# Patient Record
Sex: Male | Born: 1990 | Race: White | Hispanic: No | Marital: Single | Smoking: Former smoker
Health system: Southern US, Community
[De-identification: ages and names within clinical notes are randomized; demographics above are authoritative.]

## PROBLEM LIST (undated history)

## (undated) DIAGNOSIS — J45909 Unspecified asthma, uncomplicated: Secondary | ICD-10-CM

## (undated) HISTORY — PX: ADENOIDECTOMY: SUR15

## (undated) HISTORY — PX: TONSILLECTOMY: SUR1361

---
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
## 2012-08-18 ENCOUNTER — Encounter (HOSPITAL_COMMUNITY): Payer: Self-pay | Admitting: Emergency Medicine

## 2012-08-18 ENCOUNTER — Emergency Department (INDEPENDENT_AMBULATORY_CARE_PROVIDER_SITE_OTHER)
Admission: EM | Admit: 2012-08-18 | Discharge: 2012-08-18 | Disposition: A | Discharge status: Home / Self Care | Payer: Self-pay | Source: Home / Self Care | Attending: Family Medicine | Admitting: Family Medicine

## 2012-08-18 DIAGNOSIS — J069 Acute upper respiratory infection, unspecified: Secondary | ICD-10-CM

## 2012-08-18 DIAGNOSIS — L600 Ingrowing nail: Secondary | ICD-10-CM

## 2012-08-18 LAB — POCT RAPID STREP A: Streptococcus, Group A Screen (Direct): NEGATIVE

## 2012-08-18 MED ORDER — IPRATROPIUM BROMIDE 0.06 % NA SOLN: 2.0000 | Freq: Four times a day (QID) | NASAL | Status: DC

## 2012-08-18 NOTE — ED Notes (Signed)
Pt c/o cold sx x3 days.  Sx include: sore throat, dysphagia, nasal congestion, dry cough, headache Denies: fevers, vomiting, nauseas, diarrhea  Also c/o ingrown toe nail on left big toe  He is alert w/no signs of acute distress.

## 2012-08-18 NOTE — ED Provider Notes (Signed)
History     CSN: [id number]  Arrival date & time 08/18/12  1117   First MD Initiated Contact with Patient 08/18/12 1148      Chief Complaint  Patient presents with  . URI  . Ingrown Toenail    (Consider location/radiation/quality/duration/timing/severity/associated sxs/prior treatment) Patient is a 22 y.o. male presenting with cough. The history is provided by the patient.  Cough This is a new problem. The current episode started more than 2 days ago. The problem has been gradually worsening. The cough is non-productive. Associated symptoms include chills, rhinorrhea, sore throat and myalgias. He is not a smoker.    History reviewed. No pertinent past medical history.  History reviewed. No pertinent past surgical history.  No family history on file.  History  Substance Use Topics  . Smoking status: Never Smoker   . Smokeless tobacco: Not on file  . Alcohol Use: No      Review of Systems  Constitutional: Positive for chills.  HENT: Positive for congestion, sore throat, rhinorrhea and postnasal drip.   Respiratory: Positive for cough.   Cardiovascular: Negative.   Musculoskeletal: Positive for myalgias.    Allergies  Aspirin and Ibuprofen  Home Medications   Current Outpatient Rx  Name  Route  Sig  Dispense  Refill  . IPRATROPIUM BROMIDE 0.06 % NA SOLN   Nasal   Place 2 sprays into the nose 4 (four) times daily.   15 mL   1     BP 133/78  Pulse 92  Temp 98.4 F (36.9 C) (Oral)  Resp 16  SpO2 100%  Physical Exam  Nursing note and vitals reviewed. Constitutional: He is oriented to person, place, and time. He appears well-developed and well-nourished.  HENT:  Head: Normocephalic.  Right Ear: External ear normal.  Left Ear: External ear normal.  Mouth/Throat: Oropharynx is clear and moist.  Eyes: Conjunctivae normal are normal. Pupils are equal, round, and reactive to light.  Neck: Normal range of motion. Neck supple.  Cardiovascular: Normal rate  and regular rhythm.   Pulmonary/Chest: Breath sounds normal.  Abdominal: Soft. Bowel sounds are normal. There is no tenderness.  Musculoskeletal: He exhibits tenderness.       Feet:  Lymphadenopathy:    He has no cervical adenopathy.  Neurological: He is alert and oriented to person, place, and time.  Skin: Skin is warm and dry.    ED Course  Procedures (including critical care time)   Labs Reviewed  POCT RAPID STREP A ([facility] ONLY)   No results found.   1. URI (upper respiratory infection)   2. Ingrown left big toenail       MDM          Linna Hoff, MD 08/18/12 [phone]

## 2012-09-20 ENCOUNTER — Emergency Department (INDEPENDENT_AMBULATORY_CARE_PROVIDER_SITE_OTHER)
Admission: EM | Admit: 2012-09-20 | Discharge: 2012-09-20 | Disposition: A | Discharge status: Home / Self Care | Payer: Self-pay | Source: Home / Self Care | Attending: Family Medicine | Admitting: Family Medicine

## 2012-09-20 ENCOUNTER — Encounter (HOSPITAL_COMMUNITY): Payer: Self-pay | Admitting: Emergency Medicine

## 2012-09-20 DIAGNOSIS — L600 Ingrowing nail: Secondary | ICD-10-CM

## 2012-09-20 MED ORDER — HYDROCODONE-ACETAMINOPHEN 5-325 MG PO TABS: 1.0000 | ORAL_TABLET | Freq: Four times a day (QID) | ORAL | Status: DC | PRN

## 2012-09-20 NOTE — ED Notes (Signed)
Pt c/o ingrown toenail of left greater toe x6 months Sx include: swelling, pain, pus, drainage Denies: f/v/n/d Has not had any meds for the pain  He is alert w/no signs of acute distress.

## 2012-09-20 NOTE — ED Provider Notes (Signed)
History     CSN: [id number]  Arrival date & time 09/20/12  1246   First MD Initiated Contact with Patient 09/20/12 1408      Chief Complaint  Patient presents with  . Ingrown Toenail    (Consider location/radiation/quality/duration/timing/severity/associated sxs/prior treatment) Patient is a 22 y.o. male presenting with toe pain. The history is provided by the patient.  Toe Pain This is a chronic problem. The current episode started more than 1 week ago (12mo h/o ingrown toenail with painful sts.). The symptoms are aggravated by walking.    History reviewed. No pertinent past medical history.  History reviewed. No pertinent past surgical history.  No family history on file.  History  Substance Use Topics  . Smoking status: Never Smoker   . Smokeless tobacco: Not on file  . Alcohol Use: No      Review of Systems  Constitutional: Negative.   Skin: Positive for wound.    Allergies  Aspirin and Ibuprofen  Home Medications   Current Outpatient Rx  Name  Route  Sig  Dispense  Refill  . ipratropium (ATROVENT) 0.06 % nasal spray   Nasal   Place 2 sprays into the nose 4 (four) times daily.   15 mL   1     BP 129/72  Pulse 89  Temp(Src) 98.8 F (37.1 C) (Oral)  Resp 16  SpO2 98%  Physical Exam  Nursing note and vitals reviewed. Constitutional: He is oriented to person, place, and time. He appears well-developed and well-nourished. He appears distressed.  Musculoskeletal: He exhibits tenderness.       Feet:  Neurological: He is alert and oriented to person, place, and time.  Skin: Skin is warm and dry.    ED Course  NAIL REMOVAL Date/Time: 09/20/2012 3:20 PM Performed by: Linna Hoff Authorized by: Bradd Canary D Consent: Verbal consent obtained. Risks and benefits: risks, benefits and alternatives were discussed Consent given by: patient Location: left foot Location details: left big toe Anesthesia: local infiltration Local anesthetic:  lidocaine 2% without epinephrine Patient sedated: no Preparation: skin prepped with Betadine Amount removed: complete Wedge excision of skin of nail fold: no Nail matrix removed: none Removed nail replaced and anchored: no Dressing: Xeroform gauze Patient tolerance: Patient tolerated the procedure well with no immediate complications.   (including critical care time)  Labs Reviewed - No data to display No results found.   No diagnosis found.    MDM          Linna Hoff, MD 09/20/12 [phone]

## 2013-06-29 ENCOUNTER — Encounter (HOSPITAL_COMMUNITY): Payer: Self-pay | Admitting: Emergency Medicine

## 2013-06-29 ENCOUNTER — Other Ambulatory Visit (HOSPITAL_COMMUNITY)
Admission: RE | Admit: 2013-06-29 | Discharge: 2013-06-29 | Disposition: A | Discharge status: Home / Self Care | Payer: Self-pay | Source: Ambulatory Visit | Attending: Family Medicine | Admitting: Family Medicine

## 2013-06-29 ENCOUNTER — Emergency Department (INDEPENDENT_AMBULATORY_CARE_PROVIDER_SITE_OTHER)
Admission: EM | Admit: 2013-06-29 | Discharge: 2013-06-29 | Disposition: A | Discharge status: Home / Self Care | Payer: Self-pay | Source: Home / Self Care | Attending: Family Medicine | Admitting: Family Medicine

## 2013-06-29 DIAGNOSIS — Z9189 Other specified personal risk factors, not elsewhere classified: Secondary | ICD-10-CM

## 2013-06-29 DIAGNOSIS — Z202 Contact with and (suspected) exposure to infections with a predominantly sexual mode of transmission: Secondary | ICD-10-CM

## 2013-06-29 DIAGNOSIS — Z113 Encounter for screening for infections with a predominantly sexual mode of transmission: Secondary | ICD-10-CM | POA: Insufficient documentation

## 2013-06-29 LAB — RPR: RPR Ser Ql: NONREACTIVE

## 2013-06-29 LAB — POCT URINALYSIS DIP (DEVICE)
Bilirubin Urine: NEGATIVE
Ketones, ur: NEGATIVE mg/dL
Leukocytes, UA: NEGATIVE
pH: 6 (ref 5.0–8.0)

## 2013-06-29 NOTE — ED Provider Notes (Signed)
CSN: [id number]     Arrival date & time 06/29/13  1510 History   First MD Initiated Contact with Patient 06/29/13 1645     Chief Complaint  Patient presents with  . Exposure to STD   (Consider location/radiation/quality/duration/timing/severity/associated sxs/prior Treatment) HPI Comments: 22 year old male presents for possible exposure to genital warts. His partner called him to tell him that she had genital warts and he should be checked. They did have unprotected sexual intercourse. He is not having any symptoms at this time. He denies any lesions or warts. No penile discharge or dysuria. No testicular pain or abdominal pain. He has a recent incarceration.  Patient is a 22 y.o. male presenting with STD exposure.  Exposure to STD Pertinent negatives include no chest pain, no abdominal pain and no shortness of breath.    History reviewed. No pertinent past medical history. History reviewed. No pertinent past surgical history. No family history on file. History  Substance Use Topics  . Smoking status: Never Smoker   . Smokeless tobacco: Not on file  . Alcohol Use: No    Review of Systems  Constitutional: Negative for fever, chills and fatigue.  HENT: Negative for sore throat.   Eyes: Negative for visual disturbance.  Respiratory: Negative for cough and shortness of breath.   Cardiovascular: Negative for chest pain, palpitations and leg swelling.  Gastrointestinal: Negative for nausea, vomiting, abdominal pain, diarrhea and constipation.  Genitourinary: Negative for dysuria, urgency, frequency, hematuria, flank pain, discharge, penile swelling, scrotal swelling, difficulty urinating, genital sores, penile pain and testicular pain.  Musculoskeletal: Negative for arthralgias, myalgias, neck pain and neck stiffness.  Skin: Negative for rash.  Neurological: Negative for dizziness, weakness and light-headedness.    Allergies  Aspirin and Ibuprofen  Home Medications   Current  Outpatient Rx  Name  Route  Sig  Dispense  Refill  . HYDROcodone-acetaminophen (NORCO/VICODIN) 5-325 MG per tablet   Oral   Take 1 tablet by mouth every 6 (six) hours as needed for pain.   6 tablet   0   . ipratropium (ATROVENT) 0.06 % nasal spray   Nasal   Place 2 sprays into the nose 4 (four) times daily.   15 mL   1    BP 146/97  Pulse 85  Temp(Src) 98.4 F (36.9 C) (Oral)  Resp 16  SpO2 99% Physical Exam  Nursing note and vitals reviewed. Constitutional: He is oriented to person, place, and time. He appears well-developed and well-nourished. No distress.  HENT:  Head: Normocephalic.  Pulmonary/Chest: Effort normal. No respiratory distress.  Neurological: He is alert and oriented to person, place, and time. Coordination normal.  Skin: Skin is warm and dry. No rash noted. He is not diaphoretic.  Psychiatric: He has a normal mood and affect. Judgment normal.    ED Course  Procedures (including critical care time) Labs Review Labs Reviewed  RPR  HIV ANTIBODY (ROUTINE TESTING)  HSV 1 ANTIBODY, IGG  HSV 2 ANTIBODY, IGG  POCT URINALYSIS DIP (DEVICE)  URINE CYTOLOGY ANCILLARY ONLY   Imaging Review No results found.    MDM   1. Possible exposure to STD    Sending labs. No treatment indicated at this time. We will call with any positive results    Danny Good, PA-C 06/29/13 1707

## 2013-06-29 NOTE — ED Notes (Signed)
Pt c/o poss exposure herpes Reports he partner adv him that she has herpes and now wants to be tested He is asymptomatic  Alert w/no signs of acute distress.

## 2013-07-02 LAB — HSV 2 ANTIBODY, IGG: HSV 2 Glycoprotein G Ab, IgG: 0.11 IV

## 2013-07-02 LAB — HSV 1 ANTIBODY, IGG: HSV 1 Glycoprotein G Ab, IgG: 5.35 IV — ABNORMAL HIGH

## 2013-07-03 ENCOUNTER — Telehealth (HOSPITAL_COMMUNITY): Payer: Self-pay | Admitting: *Deleted

## 2013-07-03 NOTE — ED Notes (Addendum)
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  I called pt. and left a message to call. Vassie Moselle 07/03/2013 Unable to reach pt. by phone x 3.  [facility] marked letter sent with results. 07/14/2013

## 2013-07-07 ENCOUNTER — Emergency Department (HOSPITAL_COMMUNITY)
Admission: EM | Admit: 2013-07-07 | Discharge: 2013-07-07 | Disposition: A | Discharge status: Home / Self Care | Payer: Self-pay | Attending: Emergency Medicine | Admitting: Emergency Medicine

## 2013-07-07 ENCOUNTER — Encounter (HOSPITAL_COMMUNITY): Payer: Self-pay | Admitting: Emergency Medicine

## 2013-07-07 DIAGNOSIS — Z888 Allergy status to other drugs, medicaments and biological substances status: Secondary | ICD-10-CM | POA: Insufficient documentation

## 2013-07-07 DIAGNOSIS — R109 Unspecified abdominal pain: Secondary | ICD-10-CM | POA: Insufficient documentation

## 2013-07-07 DIAGNOSIS — J351 Hypertrophy of tonsils: Secondary | ICD-10-CM

## 2013-07-07 DIAGNOSIS — R220 Localized swelling, mass and lump, head: Secondary | ICD-10-CM | POA: Insufficient documentation

## 2013-07-07 DIAGNOSIS — R5381 Other malaise: Secondary | ICD-10-CM | POA: Insufficient documentation

## 2013-07-07 DIAGNOSIS — J029 Acute pharyngitis, unspecified: Secondary | ICD-10-CM | POA: Insufficient documentation

## 2013-07-07 DIAGNOSIS — R6883 Chills (without fever): Secondary | ICD-10-CM | POA: Insufficient documentation

## 2013-07-07 DIAGNOSIS — F17200 Nicotine dependence, unspecified, uncomplicated: Secondary | ICD-10-CM | POA: Insufficient documentation

## 2013-07-07 LAB — CBC WITH DIFFERENTIAL/PLATELET
Eosinophils Relative: 1 % (ref 0–5)
HCT: 48.3 % (ref 39.0–52.0)
Lymphocytes Relative: 16 % (ref 12–46)
Lymphs Abs: 2 10*3/uL (ref 0.7–4.0)
MCV: 82.1 fL (ref 78.0–100.0)
Monocytes Absolute: 1.4 10*3/uL — ABNORMAL HIGH (ref 0.1–1.0)
RBC: 5.88 MIL/uL — ABNORMAL HIGH (ref 4.22–5.81)
WBC: 12.7 10*3/uL — ABNORMAL HIGH (ref 4.0–10.5)

## 2013-07-07 LAB — COMPREHENSIVE METABOLIC PANEL
Albumin: 4.6 g/dL (ref 3.5–5.2)
BUN: 17 mg/dL (ref 6–23)
Calcium: 9.1 mg/dL (ref 8.4–10.5)
Creatinine, Ser: 0.77 mg/dL (ref 0.50–1.35)
Total Protein: 7.9 g/dL (ref 6.0–8.3)

## 2013-07-07 LAB — MONONUCLEOSIS SCREEN: Mono Screen: NEGATIVE

## 2013-07-07 MED ORDER — SODIUM CHLORIDE 0.9 % IV SOLN
Freq: Once | INTRAVENOUS | Status: AC
  Administered 2013-07-07: 19:00:00 via INTRAVENOUS

## 2013-07-07 MED ORDER — DEXAMETHASONE SODIUM PHOSPHATE 10 MG/ML IJ SOLN
10.0000 mg | Freq: Once | INTRAMUSCULAR | Status: AC
  Administered 2013-07-07: 10 mg via INTRAVENOUS
  Filled 2013-07-07: qty 1

## 2013-07-07 MED ORDER — DEXAMETHASONE 2 MG PO TABS: ORAL_TABLET | ORAL | Status: DC

## 2013-07-07 NOTE — ED Provider Notes (Signed)
I saw and evaluated the patient, reviewed the resident's note and I agree with the findings and plan.  EKG Interpretation   None        Here with exudative pharyngitis. Getting worse over past 3 days. Intermittent chills. No airway obstruction, no stridor. Tonsils enlarged with bilateral exudates. Patient drinking here. Strep and mono negative. Steroids given. Stable for discharge.  Dagmar Hait, MD 07/08/13 0000

## 2013-07-07 NOTE — ED Notes (Signed)
Pt c/o sore throat x 2 days with nausea and epigastric pain since he has been unable to eat for 2 days.  Tonsils red and swollen with white exudate.

## 2013-07-07 NOTE — ED Provider Notes (Signed)
CSN: [id number]     Arrival date & time 07/07/13  1616 History   First MD Initiated Contact with Patient 07/07/13 1827     Chief Complaint  Patient presents with  . Sore Throat  . Abdominal Pain   (Consider location/radiation/quality/duration/timing/severity/associated sxs/prior Treatment) HPI Chief complaint: Sore throat and fatigue history provided by patient  Patient is a 22 year old male no significant past medical history comes in today with chief complaint of sore throat and fatigue. Patient states he's had sore throat for several days. He states this is a burning pain which is worse with eating. Is localized to the posterior oropharynx no radiation of pain. Pain rated as severe. Is been present for several days it is constant and worsening. No treatment attempted. Patient also states she's been fatigue and a soft significant amounts for the past 2 days.  History reviewed. No pertinent past medical history. History reviewed. No pertinent past surgical history. No family history on file. History  Substance Use Topics  . Smoking status: Current Every Day Smoker -- 1.00 packs/day    Types: Cigarettes  . Smokeless tobacco: Not on file  . Alcohol Use: No    Review of Systems  Constitutional: Positive for fatigue. Negative for fever.  HENT: Positive for sore throat.   Respiratory: Negative for shortness of breath.   Cardiovascular: Negative for chest pain.  Gastrointestinal: Negative for abdominal pain.  Genitourinary: Negative for dysuria.  Musculoskeletal: Negative for back pain.  Skin: Negative for rash.  Neurological: Negative for headaches.  All other systems reviewed and are negative.    Allergies  Aspirin and Ibuprofen  Home Medications   Current Outpatient Rx  Name  Route  Sig  Dispense  Refill  . dexamethasone (DECADRON) 2 MG tablet      Take 10mg  (5 tabs) on Tuesday Dec. 2nd.   5 tablet   0    BP 124/76  Pulse 100  Temp(Src) 98.6 F (37 C) (Oral)   Resp 16  Ht 6' (1.829 m)  Wt 188 lb 2 oz (85.333 kg)  BMI 25.51 kg/m2  SpO2 100% Physical Exam  Nursing note and vitals reviewed. Constitutional: He is oriented to person, place, and time. He appears well-developed and well-nourished.  HENT:  Head: Normocephalic and atraumatic.  Moderate tonsillar swelling. Positive tonsillar exudates  Eyes: EOM are normal. Pupils are equal, round, and reactive to light.  Neck: Normal range of motion.  Cardiovascular: Normal rate, regular rhythm and intact distal pulses.   Triage note states tachycardic. On my exam patient heart rate 80s  Pulmonary/Chest: Effort normal and breath sounds normal. No respiratory distress.  Abdominal: Soft. He exhibits no distension. There is no tenderness. There is no rebound and no guarding.  No hepatosplenomegaly noted  Musculoskeletal: Normal range of motion. He exhibits no tenderness.  Neurological: He is alert and oriented to person, place, and time. No cranial nerve deficit. He exhibits normal muscle tone. Coordination normal.  Skin: Skin is warm and dry. No rash noted.  Psychiatric: He has a normal mood and affect. His behavior is normal. Judgment and thought content normal.    ED Course  Procedures (including critical care time) Labs Review Labs Reviewed  COMPREHENSIVE METABOLIC PANEL - Abnormal; Notable for the following:    Potassium 3.4 (*)    All other components within normal limits  CBC WITH DIFFERENTIAL - Abnormal; Notable for the following:    WBC 12.7 (*)    RBC 5.88 (*)    Hemoglobin 17.1 (*)  Neutro Abs 9.2 (*)    Monocytes Absolute 1.4 (*)    All other components within normal limits  RAPID STREP SCREEN  CULTURE, GROUP A STREP  MONONUCLEOSIS SCREEN   Imaging Review No results found.  EKG Interpretation   None       MDM   1. Sore throat   2. Swelling of tonsil     22 year old male signs and symptoms consistent with viral pharyngitis versus strep pharyngitis versus  mononucleosis. Patient has had sore throat, fatigue for several days. On exam patient has moderately enlarged tonsils bilaterally. They're erythematous with exudates. There is no sign of peritonsillar abscess or retropharyngeal abscess. Patient airways intact clear to auscultation bilaterally with no stridor. Rapid strep negative culture pending. Patient symptoms also consistent with possible mononucleosis. Monospot negative here. Patient's symptoms present for only several days. Monospot may not become positive for up to one week. Informed patient likely with viral pharyngitis versus mononucleosis. Recommend establish PCP in followup for further mono testing if desired. Further explained recommend no contact sports, heavy lifting etc. secondary to potential splenomegaly. Although on exam patient is having at this time. Patient was given Decadron for tonsillar swelling in ED. Tolerated by mouth in ED. Appropriate for discharge. Given return precautions patient was understanding no further acute issues   Bridgett Larsson, MD 07/07/13 2138

## 2013-07-09 LAB — CULTURE, GROUP A STREP

## 2013-07-11 NOTE — ED Provider Notes (Signed)
Medical screening examination/treatment/procedure(s) were performed by resident physician or non-physician practitioner and as supervising physician I was immediately available for consultation/collaboration.   Barkley Bruns MD.   Linna Hoff, MD 07/11/13 [phone]

## 2013-09-16 ENCOUNTER — Emergency Department (HOSPITAL_COMMUNITY): Payer: Self-pay

## 2013-09-16 ENCOUNTER — Emergency Department (HOSPITAL_COMMUNITY)
Admission: EM | Admit: 2013-09-16 | Discharge: 2013-09-16 | Disposition: A | Discharge status: Home / Self Care | Payer: Self-pay | Attending: Emergency Medicine | Admitting: Emergency Medicine

## 2013-09-16 ENCOUNTER — Encounter (HOSPITAL_COMMUNITY): Payer: Self-pay | Admitting: Emergency Medicine

## 2013-09-16 DIAGNOSIS — Y9389 Activity, other specified: Secondary | ICD-10-CM | POA: Insufficient documentation

## 2013-09-16 DIAGNOSIS — Z87891 Personal history of nicotine dependence: Secondary | ICD-10-CM | POA: Insufficient documentation

## 2013-09-16 DIAGNOSIS — S62309A Unspecified fracture of unspecified metacarpal bone, initial encounter for closed fracture: Secondary | ICD-10-CM | POA: Insufficient documentation

## 2013-09-16 DIAGNOSIS — J45909 Unspecified asthma, uncomplicated: Secondary | ICD-10-CM | POA: Insufficient documentation

## 2013-09-16 DIAGNOSIS — IMO0002 Reserved for concepts with insufficient information to code with codable children: Secondary | ICD-10-CM | POA: Insufficient documentation

## 2013-09-16 DIAGNOSIS — Y929 Unspecified place or not applicable: Secondary | ICD-10-CM | POA: Insufficient documentation

## 2013-09-16 HISTORY — DX: Unspecified asthma, uncomplicated: J45.909

## 2013-09-16 MED ORDER — ACETAMINOPHEN 500 MG PO TABS
1000.0000 mg | ORAL_TABLET | Freq: Once | ORAL | Status: AC
  Administered 2013-09-16: 1000 mg via ORAL
  Filled 2013-09-16: qty 2

## 2013-09-16 MED ORDER — OXYCODONE-ACETAMINOPHEN 5-325 MG PO TABS: 1.0000 | ORAL_TABLET | ORAL | Status: DC | PRN

## 2013-09-16 MED ORDER — OXYCODONE HCL 5 MG PO TABS
5.0000 mg | ORAL_TABLET | Freq: Once | ORAL | Status: AC
  Administered 2013-09-16: 5 mg via ORAL
  Filled 2013-09-16: qty 1

## 2013-09-16 NOTE — Discharge Instructions (Signed)
Make an appointment with orthopedics for follow up care. Take pain medication as directed. Rest and ice affected area.

## 2013-09-16 NOTE — ED Notes (Signed)
Waiting for ortho tech to apply sprint and sling
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
## 2013-09-19 NOTE — ED Provider Notes (Signed)
Medical screening examination/treatment/procedure(s) were performed by non-physician practitioner and as supervising physician I was immediately available for consultation/collaboration.  EKG Interpretation   None        Toy BakerAnthony T Sohail Capraro, MD 09/19/13 1112

## 2014-10-09 IMAGING — CR DG HAND COMPLETE 3+V*R*
3 series · 3 of 3 positions shown · non-contrast
Comparison: October 18, 2008

CLINICAL DATA: Pain post trauma

EXAM:
RIGHT HAND - COMPLETE 3+ VIEW

[x hand pa right]
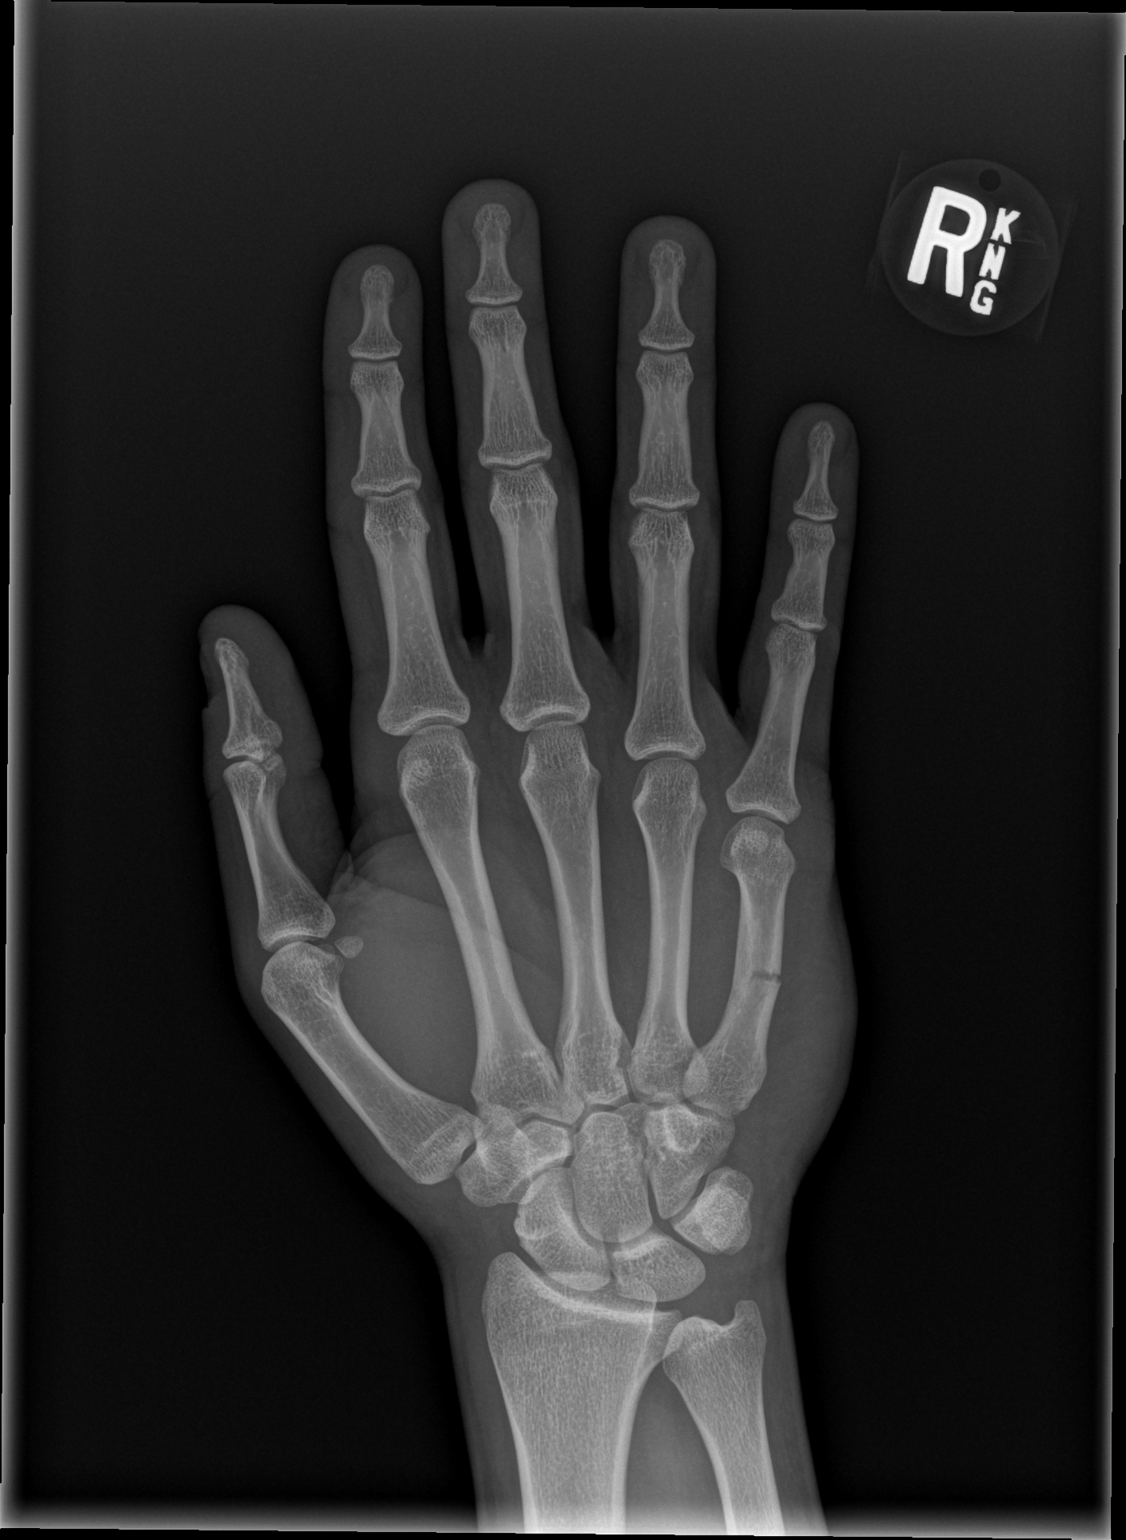

[x hand obl right]
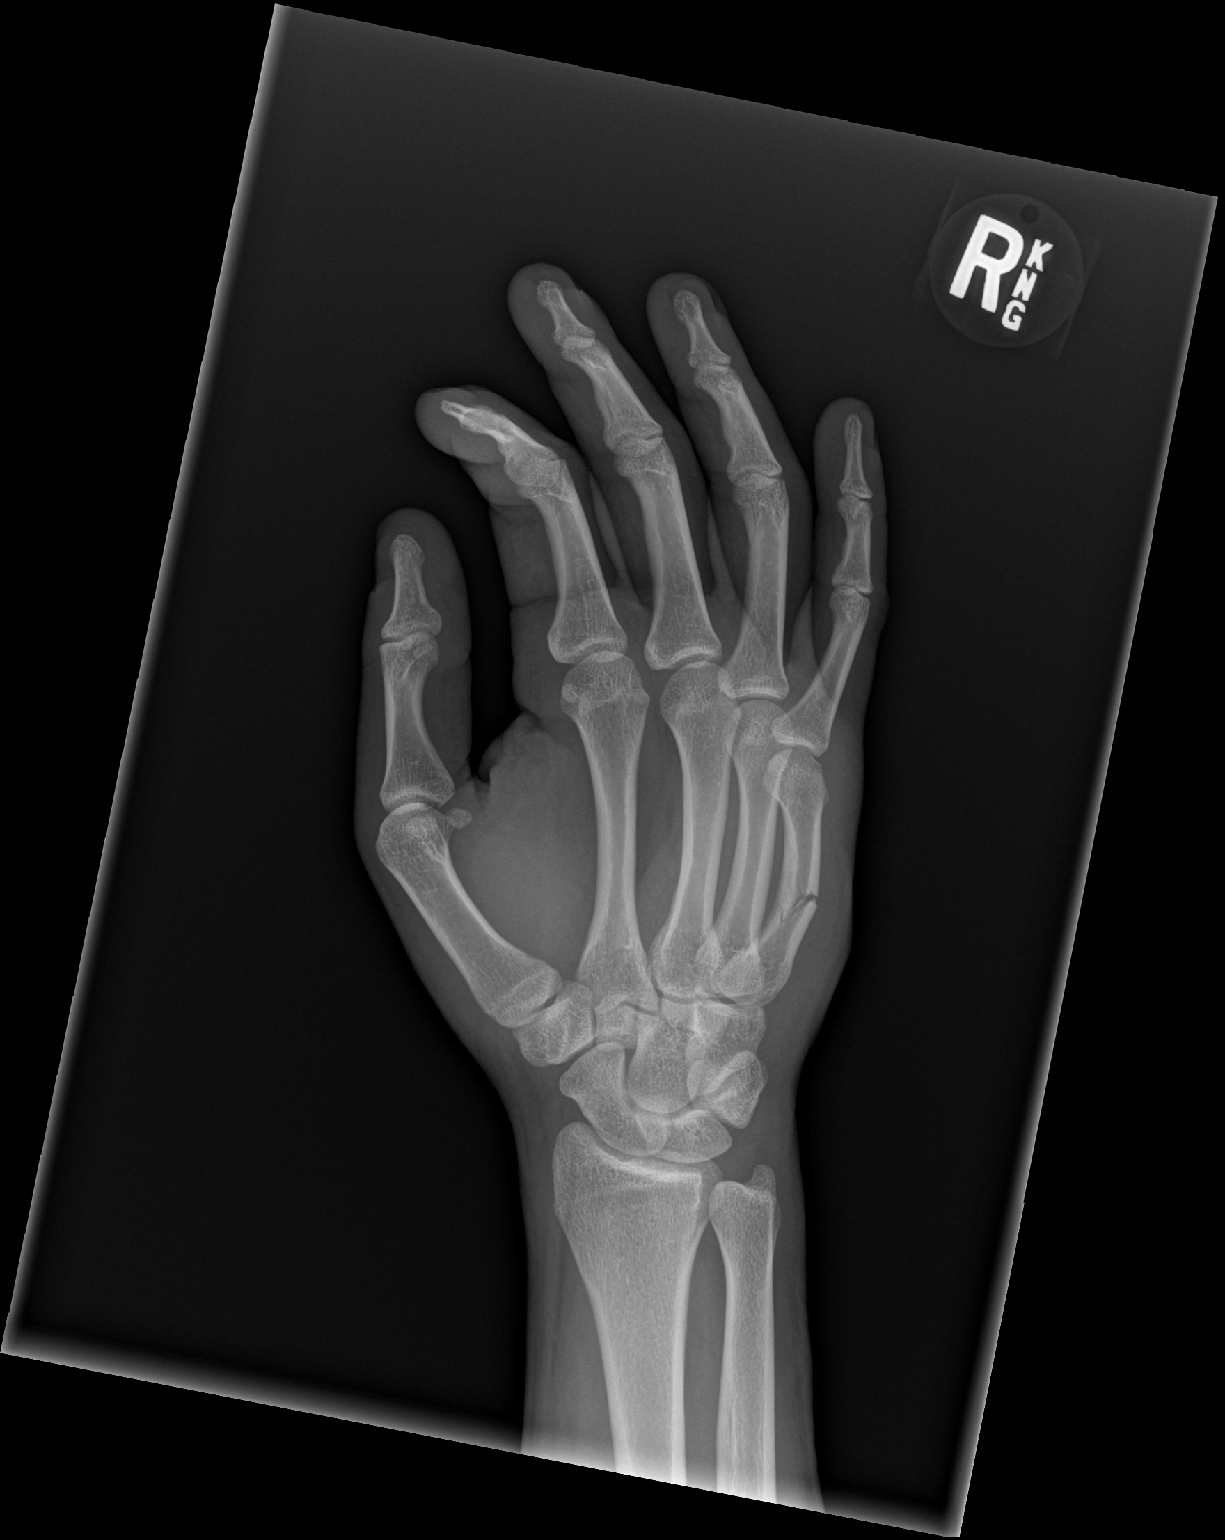

[x hand lat right]
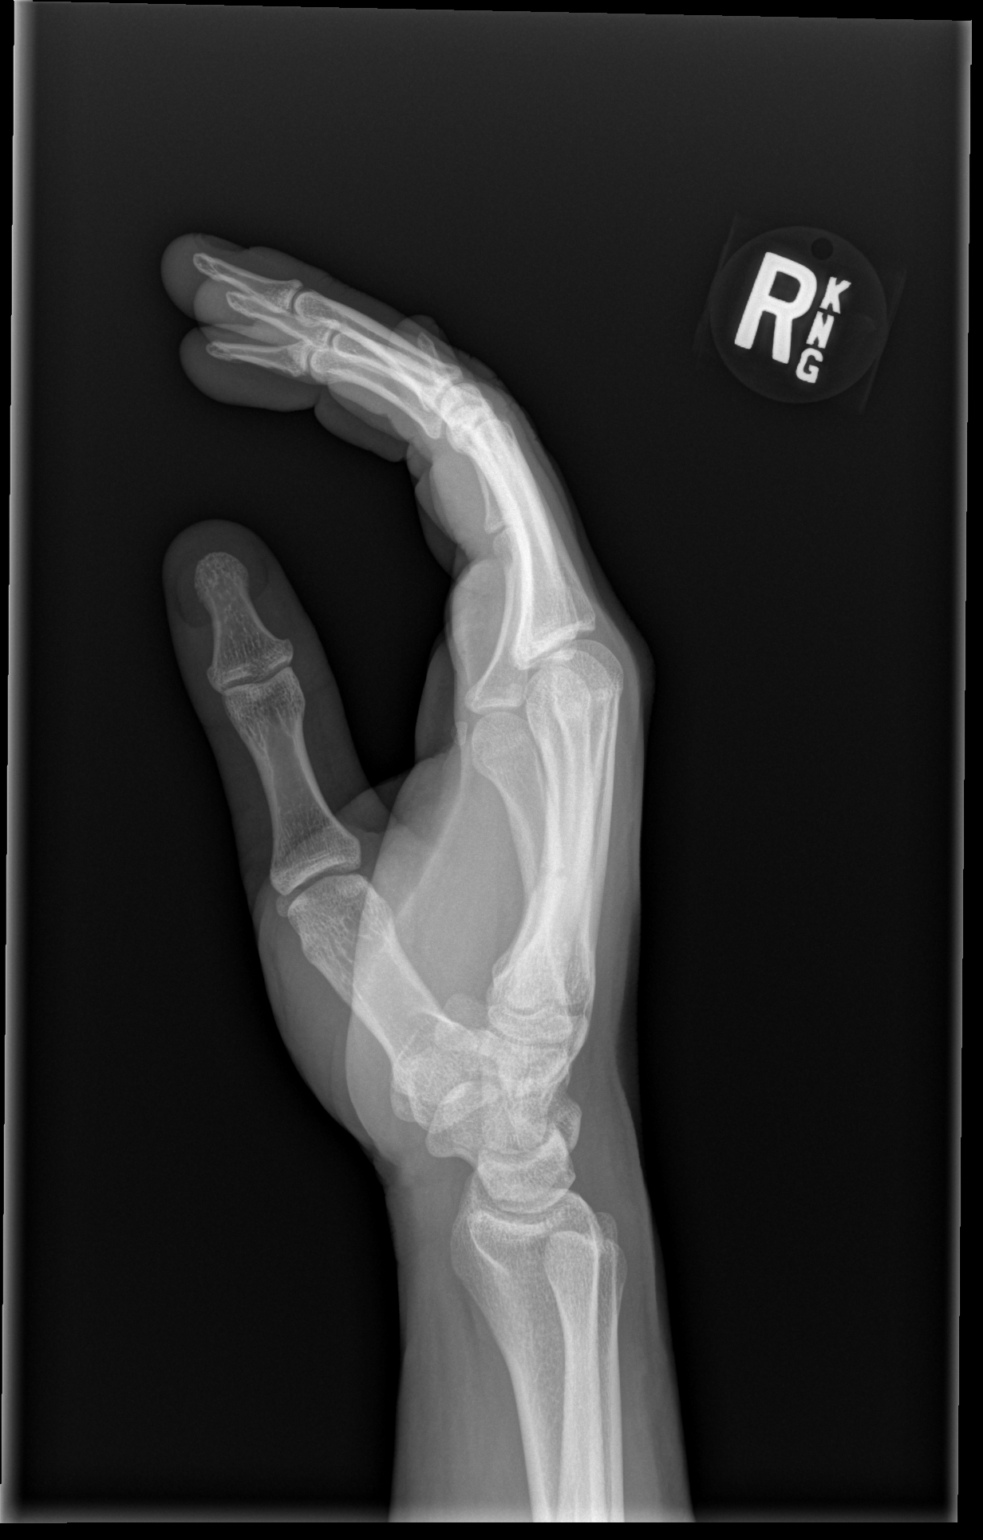

[3 of 3 positions shown; findings below may reference images not displayed]

FINDINGS: Frontal, oblique, and lateral views were obtained. There is a
fracture of the mid portion of the fifth metacarpal with slight
volar angulation distally. There is soft tissue swelling in this
area. No other fracture. No dislocation. Joint spaces appear intact.
IMPRESSION: Fracture mid fifth metacarpal with mild volar angulation distally.

## 2017-10-21 ENCOUNTER — Encounter (HOSPITAL_COMMUNITY): Payer: Self-pay | Admitting: Emergency Medicine

## 2017-10-21 ENCOUNTER — Ambulatory Visit (HOSPITAL_COMMUNITY)
Admission: EM | Admit: 2017-10-21 | Discharge: 2017-10-21 | Disposition: A | Discharge status: Home / Self Care | Payer: Self-pay | Attending: Family Medicine | Admitting: Family Medicine

## 2017-10-21 DIAGNOSIS — J020 Streptococcal pharyngitis: Secondary | ICD-10-CM

## 2017-10-21 LAB — POCT RAPID STREP A: STREPTOCOCCUS, GROUP A SCREEN (DIRECT): POSITIVE — AB

## 2017-10-21 MED ORDER — PENICILLIN G BENZATHINE 1200000 UNIT/2ML IM SUSP
INTRAMUSCULAR | Status: AC
  Filled 2017-10-21: qty 2

## 2017-10-21 MED ORDER — PENICILLIN G BENZATHINE 1200000 UNIT/2ML IM SUSP
1.2000 10*6.[IU] | Freq: Once | INTRAMUSCULAR | Status: AC
  Administered 2017-10-21: 1.2 10*6.[IU] via INTRAMUSCULAR

## 2017-10-21 NOTE — ED Provider Notes (Addendum)
  [facility]   [id number] 10/21/17 Arrival Time: 1656   SUBJECTIVE:  Danny Hart is a 27 y.o. male who presents to the urgent care with complaint of nasal congestion, HA, sore throat, younger brother recently dx with strep.   Onset of sore throat this am.  Past Medical History:  Diagnosis Date  . Asthma    No family history on file. Social History   Socioeconomic History  . Marital status: Single    Spouse name: Not on file  . Number of children: Not on file  . Years of education: Not on file  . Highest education level: Not on file  Social Needs  . Financial resource strain: Not on file  . Food insecurity - worry: Not on file  . Food insecurity - inability: Not on file  . Transportation needs - medical: Not on file  . Transportation needs - non-medical: Not on file  Occupational History  . Not on file  Tobacco Use  . Smoking status: Former Smoker    Packs/day: 0.00  Substance and Sexual Activity  . Alcohol use: Yes    Comment: 1/4 gallon of liquor per week  . Drug use: No  . Sexual activity: Not on file  Other Topics Concern  . Not on file  Social History Narrative  . Not on file   No outpatient medications have been marked as taking for the 10/21/17 encounter [facility](Hospital Encounter).   Allergies  Allergen Reactions  . Aspirin Itching  . Ibuprofen Itching      ROS: As per HPI, remainder of ROS negative.   OBJECTIVE:   Vitals:   10/21/17 1809  BP: (!) 119/58  Pulse: (!) 106  Resp: 18  Temp: 99.2 F (37.3 C)  SpO2: 100%     General appearance: alert; no distress Eyes: PERRL; EOMI; conjunctiva normal HENT: normocephalic; atraumatic; TMs reddened on right, normal on left, canal normal, external ears normal without trauma; nasal mucosa normal; oral mucosa very red posteriorly Neck: supple Back: no CVA tenderness Extremities: no cyanosis or edema; symmetrical with no gross deformities Skin: warm and dry Neurologic: normal gait; grossly  normal Psychological: alert and cooperative; normal mood and affect      Labs:  Results for orders placed or performed during the hospital encounter of 10/21/17  POCT rapid strep A [facility]  Result Value Ref Range   Streptococcus, Group A Screen (Direct) POSITIVE (A) NEGATIVE    Labs Reviewed  POCT RAPID STREP A - Abnormal; Notable for the following components:      Result Value   Streptococcus, Group A Screen (Direct) POSITIVE (*)    All other components within normal limits    No results found.     ASSESSMENT & PLAN:  1. Streptococcal sore throat     Meds ordered this encounter  Medications  . penicillin g benzathine (BICILLIN LA) 1200000 UNIT/2ML injection 1.2 Million Units    Order Specific Question:   Antibiotic Indication:    Answer:   Pharyngitis    Reviewed expectations re: course of current medical issues. Questions answered. Outlined signs and symptoms indicating need for more acute intervention. Patient verbalized understanding. After Visit Summary given.    Procedures:      Elvina SidleLauenstein, Quoc Tome, MD 10/21/17 Fredrik [id number]    Khiyan Crace, MD 10/21/17 [phone]

## 2017-10-21 NOTE — ED Triage Notes (Signed)
Pt c/o nasal congestion, HA, sore throat, younger brother recently dx with strep.

## 2017-12-27 ENCOUNTER — Other Ambulatory Visit: Payer: Self-pay

## 2017-12-27 ENCOUNTER — Ambulatory Visit (HOSPITAL_COMMUNITY)
Admission: EM | Admit: 2017-12-27 | Discharge: 2017-12-27 | Disposition: A | Discharge status: Home / Self Care | Payer: Self-pay | Attending: Family Medicine | Admitting: Family Medicine

## 2017-12-27 ENCOUNTER — Encounter (HOSPITAL_COMMUNITY): Payer: Self-pay | Admitting: Emergency Medicine

## 2017-12-27 DIAGNOSIS — H6983 Other specified disorders of Eustachian tube, bilateral: Secondary | ICD-10-CM

## 2017-12-27 DIAGNOSIS — H6993 Unspecified Eustachian tube disorder, bilateral: Secondary | ICD-10-CM

## 2017-12-27 MED ORDER — AMOXICILLIN 500 MG PO CAPS: 500.0000 mg | ORAL_CAPSULE | Freq: Three times a day (TID) | ORAL | 0 refills | Status: AC

## 2017-12-27 NOTE — Discharge Instructions (Signed)
Please use flonase nasal spray daily to help regulate pressure in ears; May also use afrin at night- but no longer than 3 days  Begin amoxicillin as prescribed for early ear infection on left  May also take daily allergy pill- zyrtec, claritin or storebrand

## 2017-12-27 NOTE — ED Triage Notes (Signed)
Pt reports bilateral ear fullness and loss of hearing.  He reports having a URI a few weeks ago and the ears have been feeling clogged up since then.

## 2017-12-27 NOTE — ED Provider Notes (Signed)
[facility]    CSN: [id number] Arrival date & time: 12/27/17  1313     History   Chief Complaint Chief Complaint  Patient presents with  . Ear Fullness    bilateral    HPI Danny Hart is a 27 y.o. male history of asthma presenting today for bilateral ear pain.  Patient states that he has had a fullness sensation as if he had gone swimming is been present for the past month.  States that he had a URI and was congested couple weeks ago.  They have been fall since then.  He has not taken anything for this.  Denies pain.  Denies fevers.  HPI  Past Medical History:  Diagnosis Date  . Asthma     There are no active problems to display for this patient.   Past Surgical History:  Procedure Laterality Date  . ADENOIDECTOMY    . TONSILLECTOMY         Home Medications    Prior to Admission medications   Medication Sig Start Date End Date Taking? Authorizing Provider  amoxicillin (AMOXIL) 500 MG capsule Take 1 capsule (500 mg total) by mouth 3 (three) times daily for 10 days. 12/27/17 01/06/18  Wash Nienhaus, Junius Creamer, PA-C    Family History History reviewed. No pertinent family history.  Social History Social History   Tobacco Use  . Smoking status: Former Smoker    Packs/day: 0.00  . Smokeless tobacco: Never Used  Substance Use Topics  . Alcohol use: Yes    Comment: 1/4 gallon of liquor per week  . Drug use: No     Allergies   Aspirin and Ibuprofen   Review of Systems Review of Systems  Constitutional: Negative for activity change, appetite change, fatigue and fever.  HENT: Positive for congestion. Negative for ear discharge, ear pain, postnasal drip, rhinorrhea, sinus pressure and sore throat.   Eyes: Negative for pain and itching.  Respiratory: Negative for cough and shortness of breath.   Cardiovascular: Negative for chest pain.  Gastrointestinal: Negative for abdominal pain, diarrhea, nausea and vomiting.  Musculoskeletal: Negative for  myalgias.  Skin: Negative for rash.  Neurological: Negative for dizziness, light-headedness and headaches.     Physical Exam Triage Vital Signs ED Triage Vitals  Enc Vitals Group     BP 12/27/17 1411 129/82     Pulse Rate 12/27/17 1411 94     Resp --      Temp 12/27/17 1411 98.5 F (36.9 C)     Temp Source 12/27/17 1411 Oral     SpO2 12/27/17 1411 100 %     Weight --      Height --      Head Circumference --      Peak Flow --      Pain Score 12/27/17 1409 0     Pain Loc --      Pain Edu? --      Excl. in GC? --    No data found.  Updated Vital Signs BP 129/82 (BP Location: Left Arm)   Pulse 94   Temp 98.5 F (36.9 C) (Oral)   SpO2 100%   Visual Acuity Right Eye Distance:   Left Eye Distance:   Bilateral Distance:    Right Eye Near:   Left Eye Near:    Bilateral Near:     Physical Exam  Constitutional: He appears well-developed and well-nourished.  HENT:  Head: Normocephalic and atraumatic.  Bilateral ears without tenderness to palpation  of external auricle, tragus and mastoid, EAC's without erythema or swelling, right TM with good bony landmarks and cone of light.  Left TMs slightly erythematous with fluid behind TM.  Nasal mucosa erythematous with significantly swollen turbinates  Moderately enlarged tonsils without erythema, no uvula deviation or swelling, posterior pharynx patent  Eyes: Conjunctivae are normal.  Neck: Neck supple.  Cardiovascular: Normal rate and regular rhythm.  No murmur heard. Pulmonary/Chest: Effort normal and breath sounds normal. No respiratory distress.  Breathing comfortably at rest, CTABL, no wheezing, rales or other adventitious sounds auscultated  Abdominal: Soft. There is no tenderness.  Musculoskeletal: He exhibits no edema.  Neurological: He is alert.  Skin: Skin is warm and dry.  Psychiatric: He has a normal mood and affect.  Nursing note and vitals reviewed.    UC Treatments / Results  Labs (all labs ordered are  listed, but only abnormal results are displayed) Labs Reviewed - No data to display  EKG None  Radiology No results found.  Procedures Procedures (including critical care time)  Medications Ordered in UC Medications - No data to display  Initial Impression / Assessment and Plan / UC Course  I have reviewed the triage vital signs and the nursing notes.  Pertinent labs & imaging results that were available during my care of the patient were reviewed by me and considered in my medical decision making (see chart for details).     Patient likely with eustachian tube dysfunction secondary to congestion.  Left TM is mildly concerning for otitis media.  We will go ahead and initiate treatment with amoxicillin.  Also recommended Flonase nasal spray as Afrin.  Recommended against using Afrin longer than 3 days.Discussed strict return precautions. Patient verbalized understanding and is agreeable with plan.  Final Clinical Impressions(s) / UC Diagnoses   Final diagnoses:  Eustachian tube dysfunction, bilateral     Discharge Instructions     Please use flonase nasal spray daily to help regulate pressure in ears; May also use afrin at night- but no longer than 3 days  Begin amoxicillin as prescribed for early ear infection on left  May also take daily allergy pill- zyrtec, claritin or storebrand     ED Prescriptions    Medication Sig Dispense Auth. Provider   amoxicillin (AMOXIL) 500 MG capsule Take 1 capsule (500 mg total) by mouth 3 (three) times daily for 10 days. 30 capsule Angelamarie Avakian C, PA-C     Controlled Substance Prescriptions [place] Controlled Substance Registry consulted? Not Applicable   Lew Dawes, [place] 12/27/17 1505
